# Patient Record
Sex: Female | Born: 1945 | Race: Black or African American | Hispanic: No | Marital: Single | Smoking: Never smoker
Health system: Southern US, Community
[De-identification: ages and names within clinical notes are randomized; demographics above are authoritative.]

## PROBLEM LIST (undated history)

## (undated) DIAGNOSIS — E7800 Pure hypercholesterolemia, unspecified: Secondary | ICD-10-CM

## (undated) DIAGNOSIS — I10 Essential (primary) hypertension: Secondary | ICD-10-CM

## (undated) HISTORY — PX: ABDOMINAL HYSTERECTOMY: SHX81

---

## 2018-03-18 ENCOUNTER — Emergency Department (HOSPITAL_BASED_OUTPATIENT_CLINIC_OR_DEPARTMENT_OTHER): Payer: No Typology Code available for payment source

## 2018-03-18 ENCOUNTER — Encounter (HOSPITAL_BASED_OUTPATIENT_CLINIC_OR_DEPARTMENT_OTHER): Payer: Self-pay

## 2018-03-18 ENCOUNTER — Emergency Department (HOSPITAL_BASED_OUTPATIENT_CLINIC_OR_DEPARTMENT_OTHER)
Admission: EM | Admit: 2018-03-18 | Discharge: 2018-03-18 | Disposition: A | Discharge status: Home / Self Care | Payer: No Typology Code available for payment source | Attending: Emergency Medicine | Admitting: Emergency Medicine

## 2018-03-18 ENCOUNTER — Other Ambulatory Visit: Payer: Self-pay

## 2018-03-18 DIAGNOSIS — Y939 Activity, unspecified: Secondary | ICD-10-CM | POA: Diagnosis not present

## 2018-03-18 DIAGNOSIS — S43004A Unspecified dislocation of right shoulder joint, initial encounter: Secondary | ICD-10-CM | POA: Insufficient documentation

## 2018-03-18 DIAGNOSIS — W19XXXA Unspecified fall, initial encounter: Secondary | ICD-10-CM | POA: Diagnosis not present

## 2018-03-18 DIAGNOSIS — S4991XA Unspecified injury of right shoulder and upper arm, initial encounter: Secondary | ICD-10-CM | POA: Diagnosis present

## 2018-03-18 DIAGNOSIS — Y990 Civilian activity done for income or pay: Secondary | ICD-10-CM | POA: Insufficient documentation

## 2018-03-18 DIAGNOSIS — I10 Essential (primary) hypertension: Secondary | ICD-10-CM | POA: Insufficient documentation

## 2018-03-18 DIAGNOSIS — Y929 Unspecified place or not applicable: Secondary | ICD-10-CM | POA: Insufficient documentation

## 2018-03-18 HISTORY — DX: Essential (primary) hypertension: I10

## 2018-03-18 HISTORY — DX: Pure hypercholesterolemia, unspecified: E78.00

## 2018-03-18 MED ORDER — ACETAMINOPHEN ER 650 MG PO TBCR: 650.0000 mg | EXTENDED_RELEASE_TABLET | Freq: Three times a day (TID) | ORAL | 0 refills | Status: AC

## 2018-03-18 MED ORDER — IBUPROFEN 400 MG PO TABS: 400.0000 mg | ORAL_TABLET | Freq: Four times a day (QID) | ORAL | 0 refills | Status: AC | PRN

## 2018-03-18 MED ORDER — FENTANYL CITRATE (PF) 100 MCG/2ML IJ SOLN
100.0000 ug | Freq: Once | INTRAMUSCULAR | Status: AC
  Administered 2018-03-18: 100 ug via INTRAVENOUS
  Filled 2018-03-18: qty 2

## 2018-03-18 NOTE — ED Notes (Signed)
NAD at this time. Pt is stable and going home.  

## 2018-03-18 NOTE — ED Triage Notes (Addendum)
Pt states she fell at work ~9am-injured right shoulder-pt went to [facility] and was sent here pt states "dislocated"-pt entered triage with poorly placed right arm sling-NAD-steady gait-pt has xray disk

## 2018-03-18 NOTE — ED Provider Notes (Signed)
[facility] EMERGENCY DEPARTMENT Provider Note   CSN: [id number] Arrival date & time: 03/18/18  1128     History   Chief Complaint Chief Complaint  Patient presents with  . Shoulder Injury    HPI Taylor Meyers is a 72 y.o. female.  HPI  72 year old female with history of hypertension and hyperlipidemia comes in with chief complaint of fall. She reports that she was at work when she had a mechanical fall.  She fell onto her shoulder and his having severe pain.  The injury occurred around 830 or 9 in the morning.  Patient denies any associated numbness, tingling.  Patient went to urgent care, where they were unable to reduce the joint -therefore she was sent to the ER.  Patient did not strike her head.  She denies headaches, loss of consciousness, nausea, vomiting, confusion, focal numbness or weakness.  She also denies neck pain.  No underlying lung disease, heart disease no history of adverse reaction to anesthesia.  Past Medical History:  Diagnosis Date  . High cholesterol   . Hypertension     There are no active problems to display for this patient.   Past Surgical History:  Procedure Laterality Date  . ABDOMINAL HYSTERECTOMY       OB History   No obstetric history on file.      Home Medications    Prior to Admission medications   Medication Sig Start Date End Date Taking? Authorizing Provider  acetaminophen (TYLENOL 8 HOUR) 650 MG CR tablet Take 1 tablet (650 mg total) by mouth every 8 (eight) hours. 03/18/18   Derwood KaplanNanavati, Kayanna Mckillop, MD  ibuprofen (ADVIL,MOTRIN) 400 MG tablet Take 1 tablet (400 mg total) by mouth every 6 (six) hours as needed. 03/18/18   Derwood KaplanNanavati, Jammy Plotkin, MD    Family History No family history on file.  Social History Social History   Tobacco Use  . Smoking status: Never Smoker  . Smokeless tobacco: Never Used  Substance Use Topics  . Alcohol use: Yes    Comment: occ  . Drug use: Never     Allergies   Patient has no  known allergies.   Review of Systems Review of Systems  Constitutional: Positive for activity change.  Musculoskeletal: Positive for arthralgias.  Neurological: Negative for numbness and headaches.  Hematological: Does not bruise/bleed easily.     Physical Exam Updated Vital Signs BP 132/76 (BP Location: Right Arm)   Pulse 71   Temp 97.6 F (36.4 C) (Oral)   Resp 18   Ht 5\' 7"  (1.702 m)   Wt 86.2 kg   SpO2 97%   BMI 29.76 kg/m   Physical Exam Vitals signs and nursing note reviewed.  Constitutional:      Appearance: She is well-developed.  HENT:     Head: Normocephalic and atraumatic.  Neck:     Musculoskeletal: Normal range of motion and neck supple.  Cardiovascular:     Rate and Rhythm: Normal rate.  Pulmonary:     Effort: Pulmonary effort is normal.  Abdominal:     General: Bowel sounds are normal.  Musculoskeletal:        General: Swelling, tenderness and deformity present.  Skin:    General: Skin is warm and dry.  Neurological:     Mental Status: She is alert and oriented to person, place, and time.      ED Treatments / Results  Labs (all labs ordered are listed, but only abnormal results are displayed) Labs Reviewed - No  data to display  EKG None  Radiology Dg Shoulder Right  Result Date: 03/18/2018 CLINICAL DATA:  RIGHT shoulder pain and tenderness after falling this morning EXAM: RIGHT SHOULDER - 2+ VIEW COMPARISON:  None FINDINGS: Osseous mineralization normal. AC joint alignment normal. RIGHT anterior inferior glenohumeral dislocation. No fracture identified. Visualized RIGHT ribs intact. IMPRESSION: RIGHT anterior inferior RIGHT glenohumeral dislocation. Electronically Signed   By: Ulyses SouthwardMark  Boles M.D.   On: 03/18/2018 12:08    Procedures Reduction of dislocation Date/Time: 03/18/2018 12:22 PM Performed by: Derwood KaplanNanavati, Elyjah Hazan, MD Authorized by: Derwood KaplanNanavati, Yolanda Huffstetler, MD  Consent: Verbal consent obtained. Risks and benefits: risks, benefits and  alternatives were discussed Consent given by: patient Patient understanding: patient states understanding of the procedure being performed Site marked: the operative site was marked Imaging studies: imaging studies available Patient identity confirmed: arm band Time out: Immediately prior to procedure a "time out" was called to verify the correct patient, procedure, equipment, support staff and site/side marked as required. Local anesthesia used: no  Anesthesia: Local anesthesia used: no  Sedation: Patient sedated: no  Patient tolerance: Patient tolerated the procedure well with no immediate complications    (including critical care time)  Medications Ordered in ED Medications  fentaNYL (SUBLIMAZE) injection 100 mcg (100 mcg Intravenous Given 03/18/18 1222)     Initial Impression / Assessment and Plan / ED Course  I have reviewed the triage vital signs and the nursing notes.  Pertinent labs & imaging results that were available during my care of the patient were reviewed by me and considered in my medical decision making (see chart for details).     72 year old female comes in with chief complaint of fall.  Patient fell directly onto her right shoulder and is noted to have a shoulder dislocation.  She did not strike her head and is denying any leg suggestive of intracranial hemorrhage  She does not have C-spine pain on exam.  Low she has shoulder dislocation, I feel comfortable clearing her brain and C-spine clinically.   12:58 PM Patient was given 100 mcg of fentanyl in total. Shoulder has been reduced successfully.  No deep procedural sedation was required.  Final Clinical Impressions(s) / ED Diagnoses   Final diagnoses:  Shoulder dislocation, right, initial encounter    ED Discharge Orders         Ordered    ibuprofen (ADVIL,MOTRIN) 400 MG tablet  Every 6 hours PRN     03/18/18 1257    acetaminophen (TYLENOL 8 HOUR) 650 MG CR tablet  Every 8 hours     03/18/18  1257           Derwood KaplanNanavati, Stacey Maura, MD 03/18/18 1259

## 2018-03-18 NOTE — Discharge Instructions (Addendum)
We saw in the ER for your shoulder injury.  Your shoulder was dislocated, and we were able to reduce it in the ER.  Please keep the arm in the sling.  Apply ice for pain.  Take the medication prescribed for further pain control.  See the orthopedic doctor in 5 to 7 days for further management of the injury.

## 2019-12-31 IMAGING — DX DG SHOULDER 2+V PORT*R*
2 series · 2 of 2 positions shown · non-contrast
Comparison: Portable exam 3890 hours compared to earlier study
03/18/2018

CLINICAL DATA: Post reduction RIGHT shoulder

EXAM:
PORTABLE RIGHT SHOULDER

[shoulder ap]
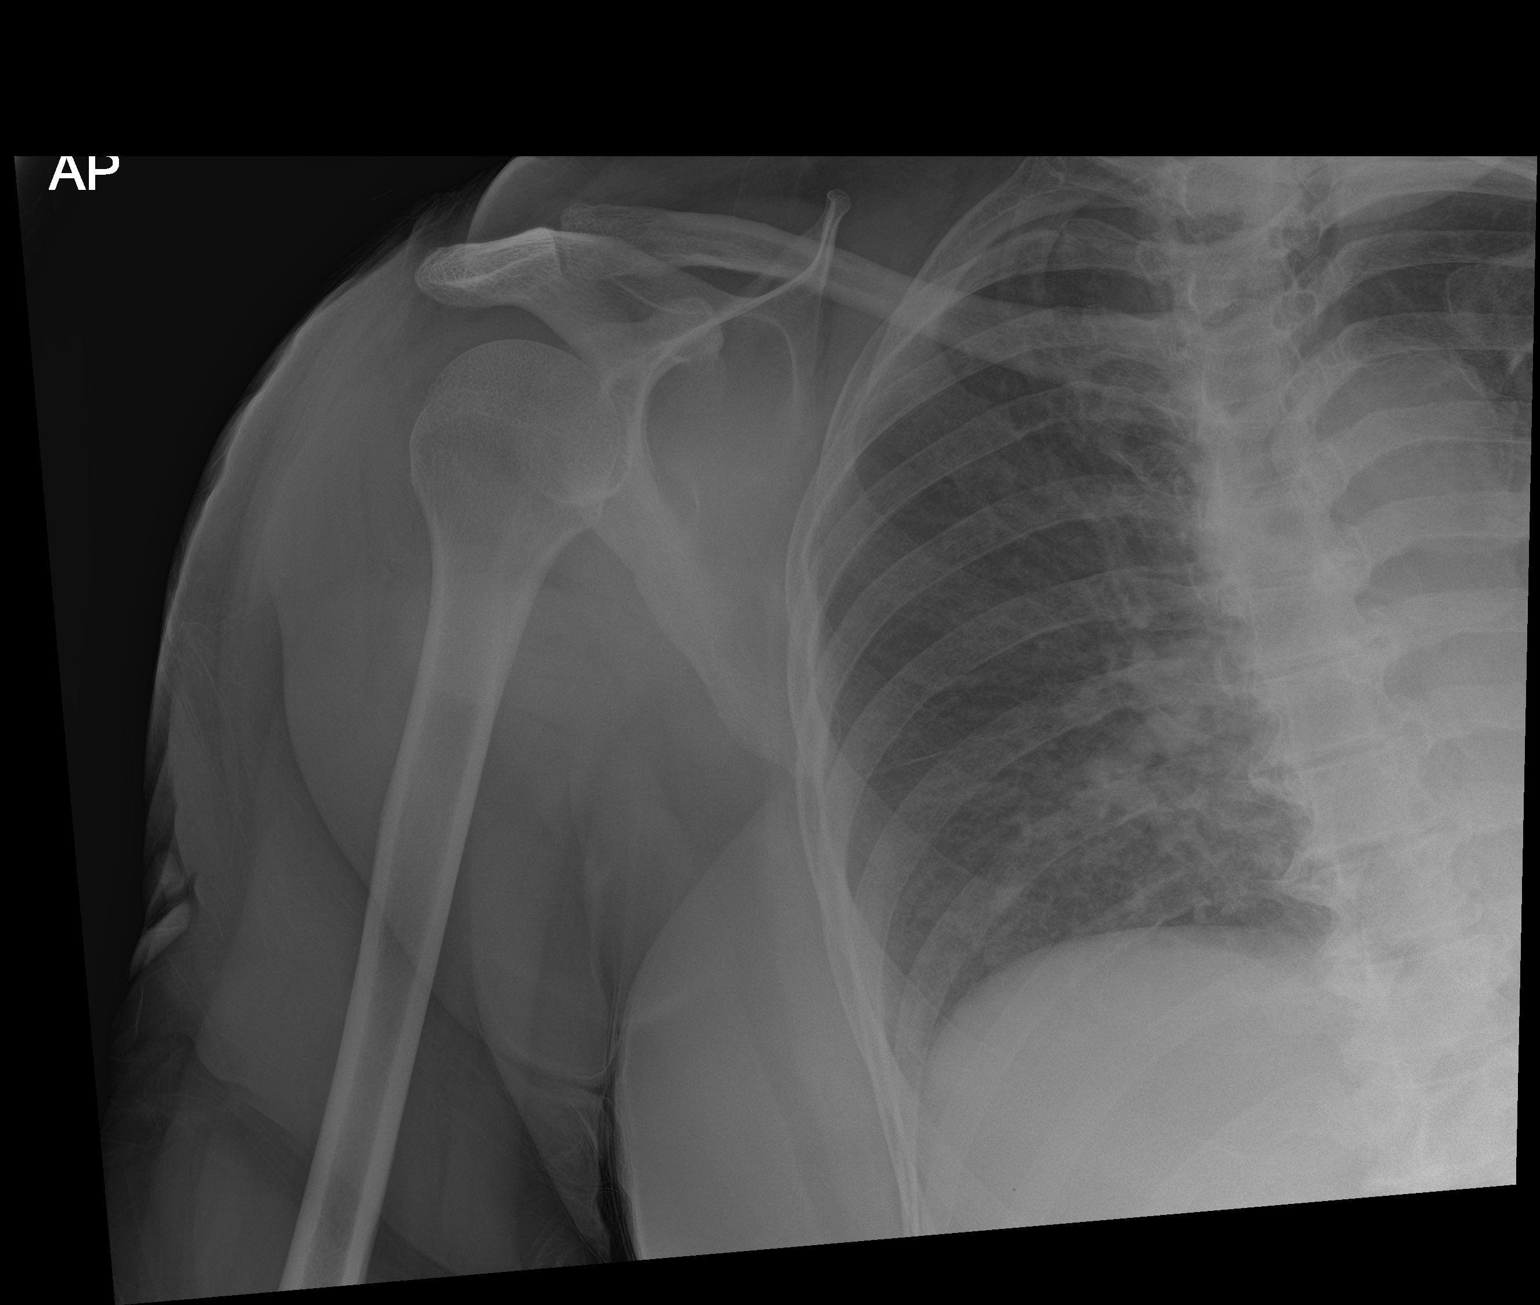

[shoulder y-view]
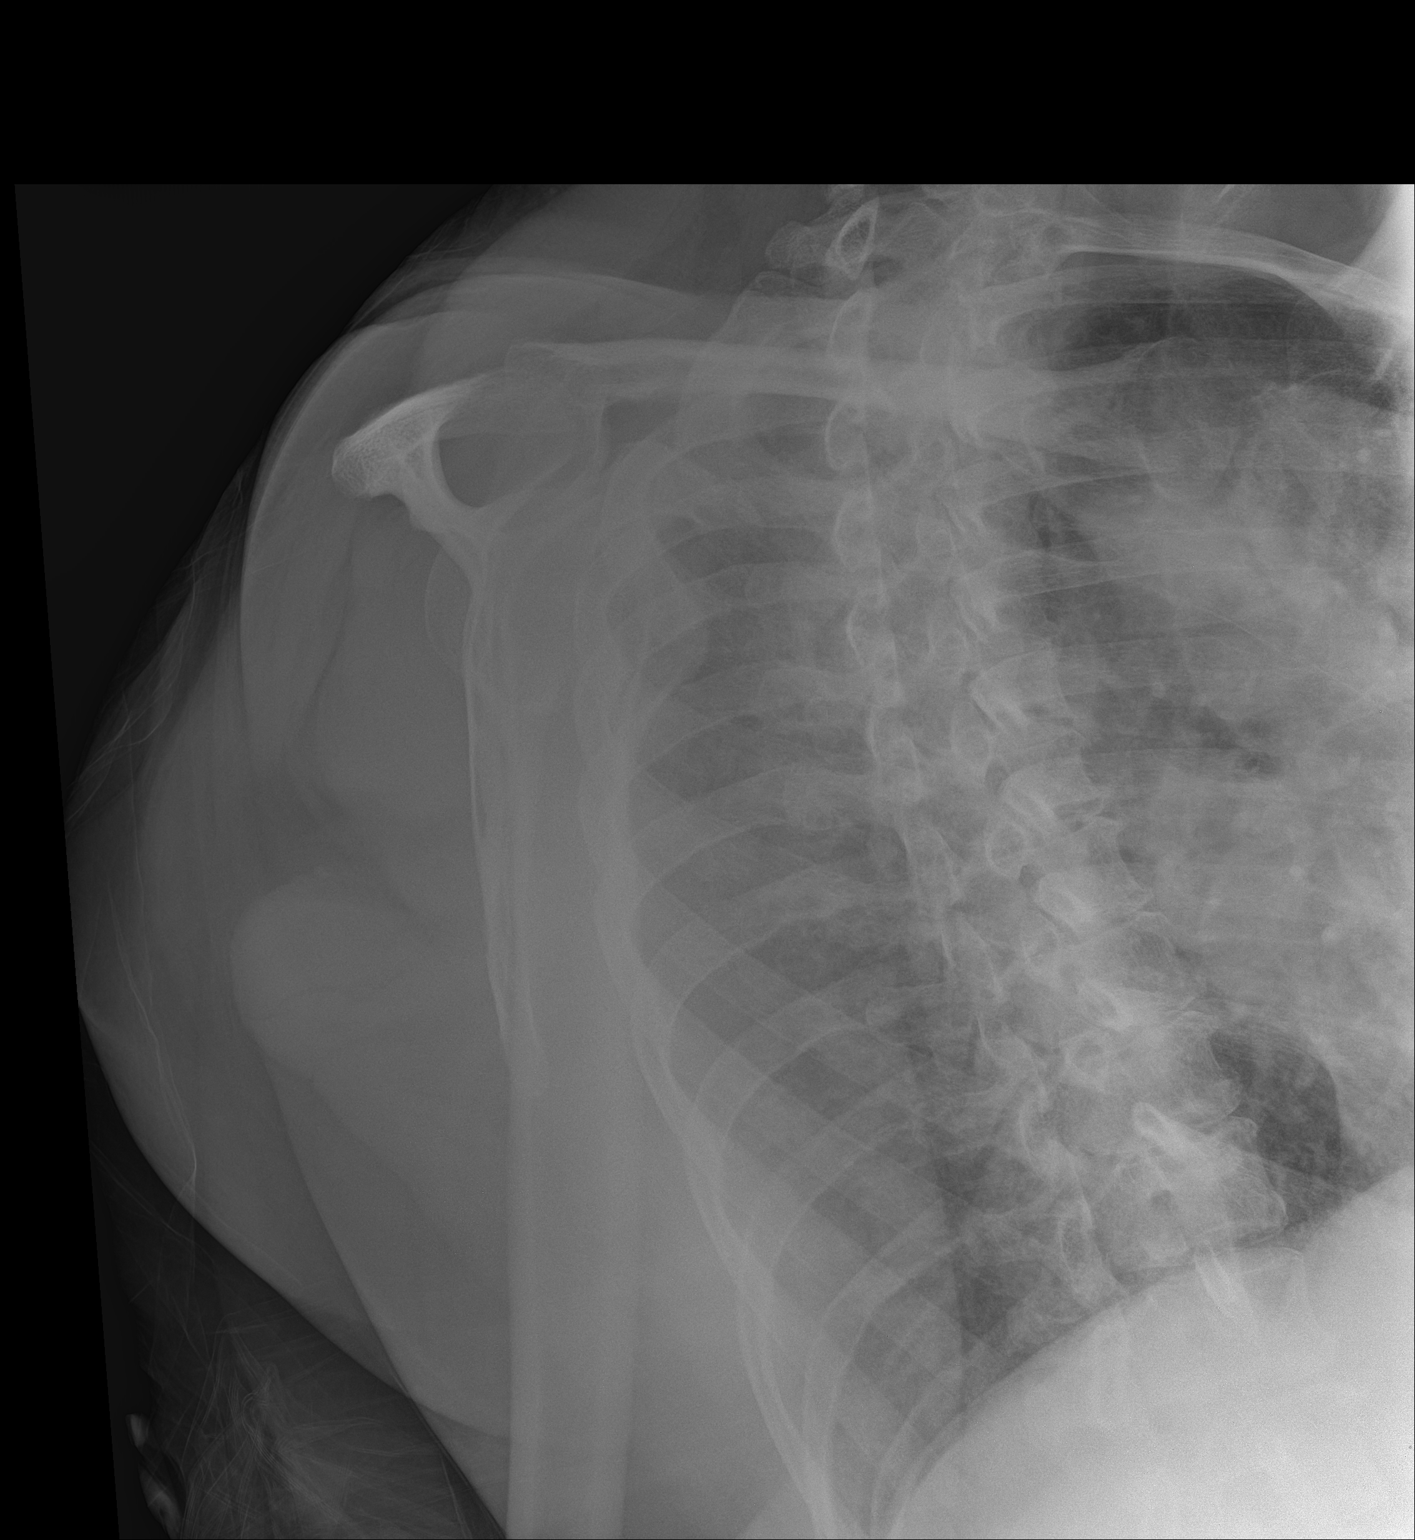

[2 of 2 positions shown; findings below may reference images not displayed]

FINDINGS: RIGHT glenohumeral dislocation seen on earlier exam now appears
reduced.

No fractures identified.

Osseous mineralization normal.
IMPRESSION: Interval reduction of previously identified RIGHT glenohumeral
dislocation.

## 2019-12-31 IMAGING — DX DG SHOULDER 2+V*R*
3 series · 3 of 3 positions shown · non-contrast
Comparison: None

CLINICAL DATA: RIGHT shoulder pain and tenderness after falling
this morning

EXAM:
RIGHT SHOULDER - 2+ VIEW

[shoulder grashey]
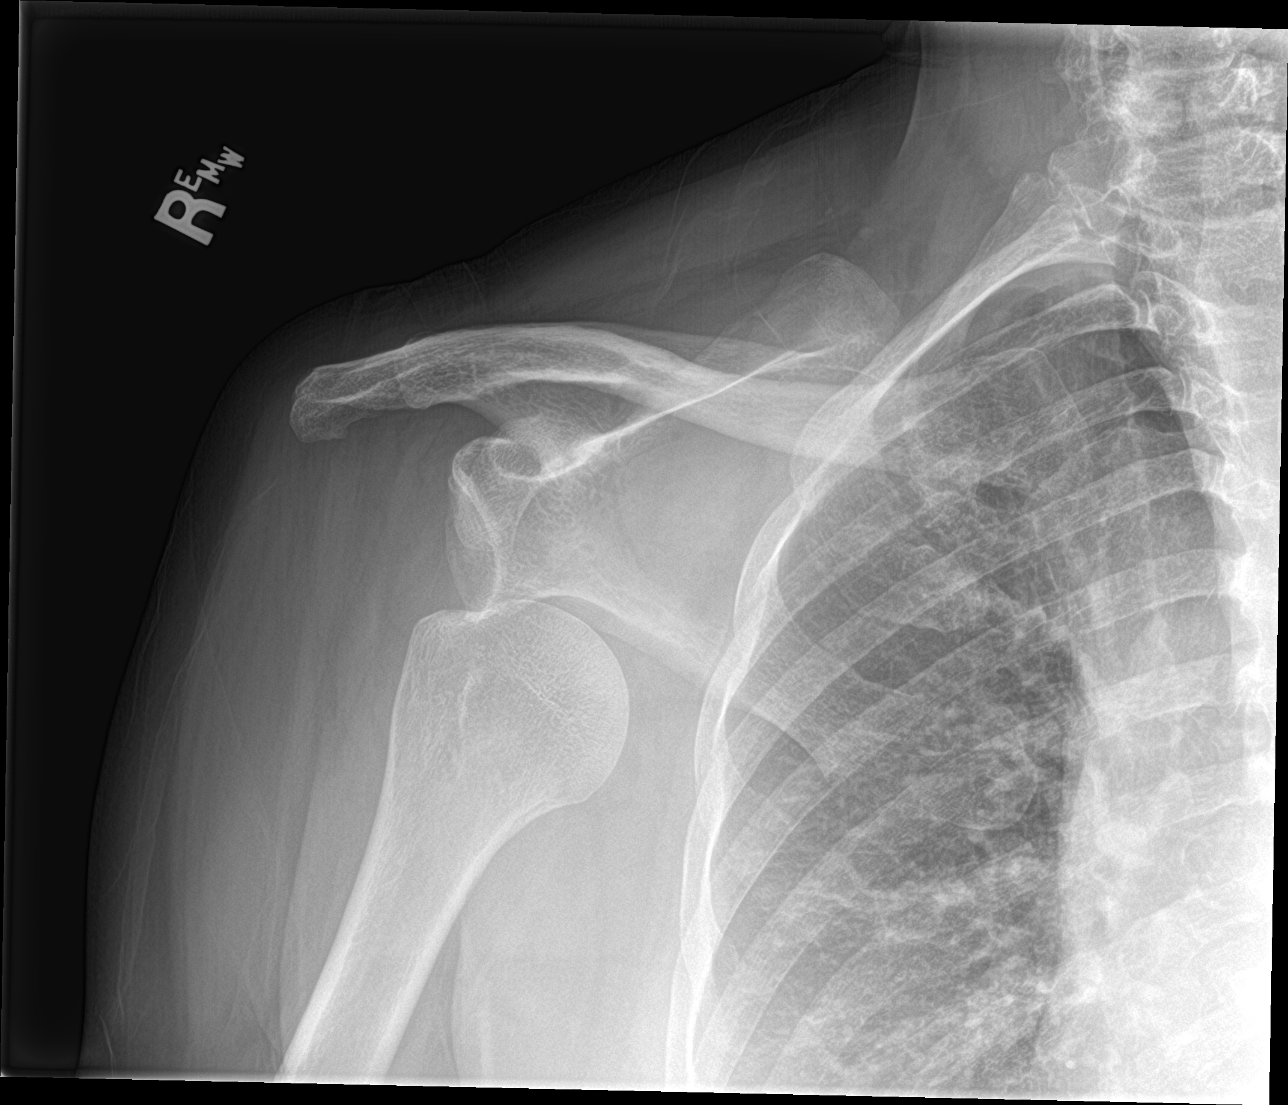

[shoulder y view]
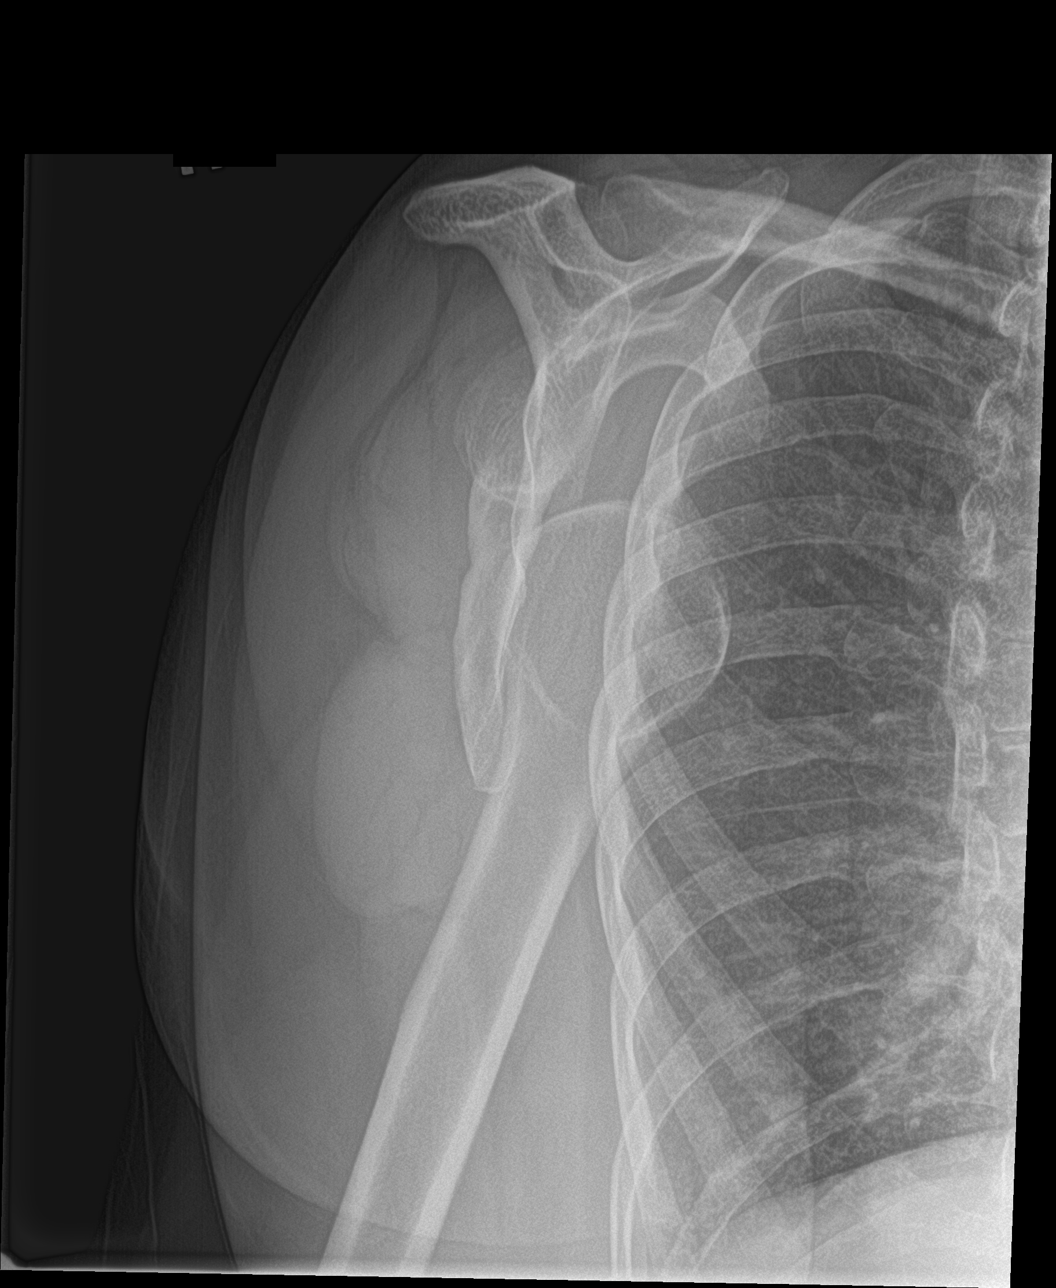

[shoulder ap neutral]
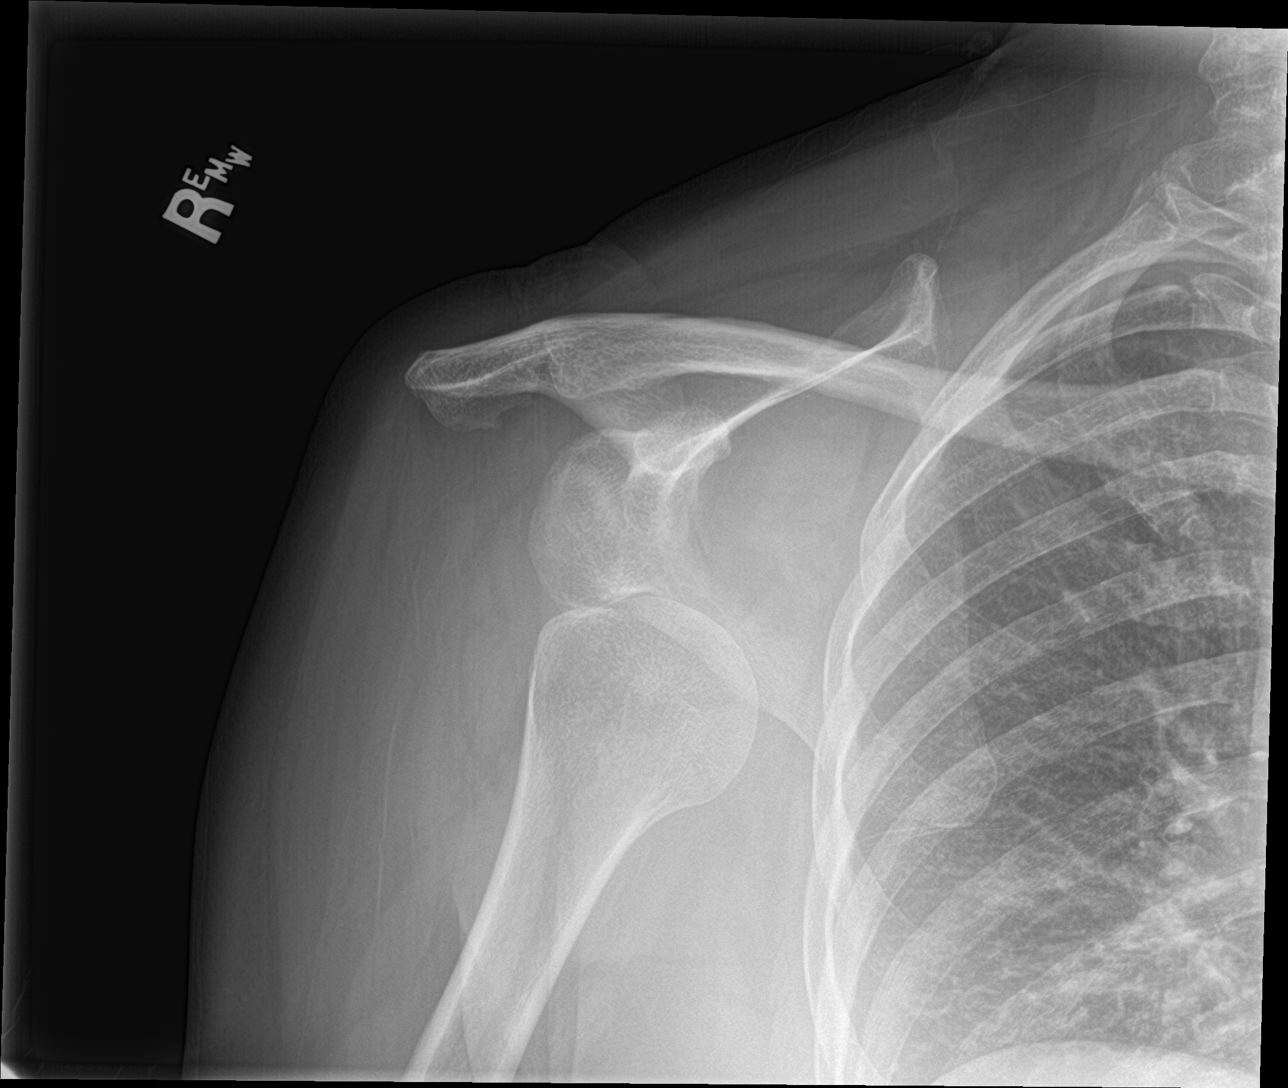

[3 of 3 positions shown; findings below may reference images not displayed]

FINDINGS: Osseous mineralization normal.

AC joint alignment normal.

RIGHT anterior inferior glenohumeral dislocation.

No fracture identified.

Visualized RIGHT ribs intact.
IMPRESSION: RIGHT anterior inferior RIGHT glenohumeral dislocation.
